# Patient Record
Sex: Female | Born: 1965 | Race: White | Hispanic: No | Marital: Married | Smoking: Current every day smoker
Health system: Southern US, Community
[De-identification: ages and names within clinical notes are randomized; demographics above are authoritative.]

## PROBLEM LIST (undated history)

## (undated) HISTORY — PX: APPENDECTOMY: SHX54

---

## 2017-04-07 ENCOUNTER — Emergency Department (HOSPITAL_COMMUNITY)
Admission: EM | Admit: 2017-04-07 | Discharge: 2017-04-07 | Disposition: A | Discharge status: Home / Self Care | Payer: BLUE CROSS/BLUE SHIELD | Attending: Emergency Medicine | Admitting: Emergency Medicine

## 2017-04-07 ENCOUNTER — Encounter (HOSPITAL_COMMUNITY): Payer: Self-pay | Admitting: Emergency Medicine

## 2017-04-07 ENCOUNTER — Emergency Department (HOSPITAL_COMMUNITY): Payer: BLUE CROSS/BLUE SHIELD

## 2017-04-07 DIAGNOSIS — Y92410 Unspecified street and highway as the place of occurrence of the external cause: Secondary | ICD-10-CM | POA: Diagnosis not present

## 2017-04-07 DIAGNOSIS — S81011A Laceration without foreign body, right knee, initial encounter: Secondary | ICD-10-CM | POA: Diagnosis not present

## 2017-04-07 DIAGNOSIS — S65311A Laceration of deep palmar arch of right hand, initial encounter: Secondary | ICD-10-CM | POA: Diagnosis not present

## 2017-04-07 DIAGNOSIS — S20219A Contusion of unspecified front wall of thorax, initial encounter: Secondary | ICD-10-CM | POA: Insufficient documentation

## 2017-04-07 DIAGNOSIS — F17290 Nicotine dependence, other tobacco product, uncomplicated: Secondary | ICD-10-CM | POA: Insufficient documentation

## 2017-04-07 DIAGNOSIS — S301XXA Contusion of abdominal wall, initial encounter: Secondary | ICD-10-CM | POA: Diagnosis not present

## 2017-04-07 DIAGNOSIS — Y939 Activity, unspecified: Secondary | ICD-10-CM | POA: Diagnosis not present

## 2017-04-07 DIAGNOSIS — Y999 Unspecified external cause status: Secondary | ICD-10-CM | POA: Insufficient documentation

## 2017-04-07 DIAGNOSIS — S3092XA Unspecified superficial injury of abdominal wall, initial encounter: Secondary | ICD-10-CM | POA: Diagnosis present

## 2017-04-07 LAB — CBC WITH DIFFERENTIAL/PLATELET
Basophils Absolute: 0 10*3/uL (ref 0.0–0.1)
Basophils Relative: 0 %
Eosinophils Absolute: 0 10*3/uL (ref 0.0–0.7)
Eosinophils Relative: 0 %
HEMATOCRIT: 39.3 % (ref 36.0–46.0)
HEMOGLOBIN: 13 g/dL (ref 12.0–15.0)
LYMPHS ABS: 0.8 10*3/uL (ref 0.7–4.0)
LYMPHS PCT: 4 %
MCH: 29.8 pg (ref 26.0–34.0)
MCHC: 33.1 g/dL (ref 30.0–36.0)
MCV: 90.1 fL (ref 78.0–100.0)
MONO ABS: 1.4 10*3/uL — AB (ref 0.1–1.0)
MONOS PCT: 7 %
NEUTROS ABS: 17.8 10*3/uL — AB (ref 1.7–7.7)
NEUTROS PCT: 89 %
Platelets: 320 10*3/uL (ref 150–400)
RBC: 4.36 MIL/uL (ref 3.87–5.11)
RDW: 14.5 % (ref 11.5–15.5)
WBC: 19.9 10*3/uL — ABNORMAL HIGH (ref 4.0–10.5)

## 2017-04-07 LAB — BASIC METABOLIC PANEL
ANION GAP: 11 (ref 5–15)
BUN: 17 mg/dL (ref 6–20)
CALCIUM: 9.2 mg/dL (ref 8.9–10.3)
CHLORIDE: 106 mmol/L (ref 101–111)
CO2: 21 mmol/L — ABNORMAL LOW (ref 22–32)
Creatinine, Ser: 0.72 mg/dL (ref 0.44–1.00)
GFR calc non Af Amer: >60 mL/min (ref >60)
GLUCOSE: 110 mg/dL — AB (ref 65–99)
Potassium: 4.2 mmol/L (ref 3.5–5.1)
Sodium: 138 mmol/L (ref 135–145)

## 2017-04-07 MED ORDER — POVIDONE-IODINE 10 % EX SOLN
CUTANEOUS | Status: DC | PRN
  Administered 2017-04-07: 13:00:00 via TOPICAL
  Filled 2017-04-07: qty 15

## 2017-04-07 MED ORDER — LIDOCAINE HCL (PF) 2 % IJ SOLN: 10.0000 mL | Freq: Once | INTRAMUSCULAR | Status: DC

## 2017-04-07 MED ORDER — LIDOCAINE HCL (PF) 1 % IJ SOLN
10.0000 mL | Freq: Once | INTRAMUSCULAR | Status: AC
  Administered 2017-04-07: 10 mL via INTRADERMAL
  Filled 2017-04-07: qty 10

## 2017-04-07 MED ORDER — METHOCARBAMOL 500 MG PO TABS: 500.0000 mg | ORAL_TABLET | Freq: Two times a day (BID) | ORAL | 0 refills | Status: AC

## 2017-04-07 MED ORDER — IOPAMIDOL (ISOVUE-300) INJECTION 61%
100.0000 mL | Freq: Once | INTRAVENOUS | Status: AC | PRN
  Administered 2017-04-07: 100 mL via INTRAVENOUS

## 2017-04-07 MED ORDER — LIDOCAINE HCL 2 % IJ SOLN
5.0000 mL | Freq: Once | INTRAMUSCULAR | Status: DC
  Filled 2017-04-07: qty 10

## 2017-04-07 MED ORDER — FAMOTIDINE 20 MG PO TABS
10.0000 mg | ORAL_TABLET | Freq: Once | ORAL | Status: AC
  Administered 2017-04-07: 10 mg via ORAL
  Filled 2017-04-07: qty 1

## 2017-04-07 MED ORDER — HYDROCODONE-ACETAMINOPHEN 5-325 MG PO TABS: 2.0000 | ORAL_TABLET | ORAL | 0 refills | Status: AC | PRN

## 2017-04-07 MED ORDER — OXYCODONE-ACETAMINOPHEN 5-325 MG PO TABS
2.0000 | ORAL_TABLET | Freq: Once | ORAL | Status: AC
  Administered 2017-04-07: 2 via ORAL
  Filled 2017-04-07: qty 2
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
## 2017-04-07 NOTE — Discharge Instructions (Signed)
Suture removal in 8 days.  See your Physician for recheck.  The radiologist has recommended that you have an MRi of your liver and pancreas due to possible mass
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
